# Patient Record
Sex: Male | Born: 1980 | Hispanic: No | Marital: Single | Smoking: Current every day smoker
Health system: Southern US, Community
[De-identification: ages and names within clinical notes are randomized; demographics above are authoritative.]

## PROBLEM LIST (undated history)

## (undated) DIAGNOSIS — S43109A Unspecified dislocation of unspecified acromioclavicular joint, initial encounter: Secondary | ICD-10-CM

## (undated) HISTORY — PX: NO PAST SURGERIES: SHX2092

---
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
## 2013-09-10 DIAGNOSIS — S43109A Unspecified dislocation of unspecified acromioclavicular joint, initial encounter: Secondary | ICD-10-CM

## 2013-09-10 HISTORY — DX: Unspecified dislocation of unspecified acromioclavicular joint, initial encounter: S43.109A

## 2013-10-02 ENCOUNTER — Encounter (HOSPITAL_COMMUNITY): Payer: Self-pay | Admitting: Emergency Medicine

## 2013-10-02 ENCOUNTER — Emergency Department (INDEPENDENT_AMBULATORY_CARE_PROVIDER_SITE_OTHER)
Admission: EM | Admit: 2013-10-02 | Discharge: 2013-10-02 | Disposition: A | Discharge status: Home / Self Care | Payer: Self-pay | Source: Home / Self Care

## 2013-10-02 ENCOUNTER — Emergency Department (INDEPENDENT_AMBULATORY_CARE_PROVIDER_SITE_OTHER): Payer: Self-pay

## 2013-10-02 DIAGNOSIS — S42023A Displaced fracture of shaft of unspecified clavicle, initial encounter for closed fracture: Secondary | ICD-10-CM

## 2013-10-02 DIAGNOSIS — S42022A Displaced fracture of shaft of left clavicle, initial encounter for closed fracture: Secondary | ICD-10-CM

## 2013-10-02 DIAGNOSIS — S43109A Unspecified dislocation of unspecified acromioclavicular joint, initial encounter: Secondary | ICD-10-CM

## 2013-10-02 DIAGNOSIS — S43102A Unspecified dislocation of left acromioclavicular joint, initial encounter: Secondary | ICD-10-CM

## 2013-10-02 MED ORDER — HYDROCODONE-ACETAMINOPHEN 7.5-325 MG PO TABS: 1.0000 | ORAL_TABLET | ORAL | Status: DC | PRN

## 2013-10-02 MED ORDER — KETOROLAC TROMETHAMINE 60 MG/2ML IM SOLN
60.0000 mg | Freq: Once | INTRAMUSCULAR | Status: AC
  Administered 2013-10-02: 60 mg via INTRAMUSCULAR

## 2013-10-02 MED ORDER — HYDROMORPHONE HCL 1 MG/ML IJ SOLN
1.0000 mg | Freq: Once | INTRAMUSCULAR | Status: AC
  Administered 2013-10-02: 1 mg via INTRAMUSCULAR

## 2013-10-02 MED ORDER — HYDROMORPHONE HCL PF 1 MG/ML IJ SOLN
INTRAMUSCULAR | Status: AC
  Filled 2013-10-02: qty 1

## 2013-10-02 MED ORDER — KETOROLAC TROMETHAMINE 60 MG/2ML IM SOLN
INTRAMUSCULAR | Status: AC
  Filled 2013-10-02: qty 2

## 2013-10-02 MED ORDER — DICLOFENAC POTASSIUM 50 MG PO TABS: 50.0000 mg | ORAL_TABLET | Freq: Three times a day (TID) | ORAL | Status: DC

## 2013-10-02 NOTE — Discharge Instructions (Signed)
Traumatismo acromioclavicular (Acromioclavicular Injuries) Wear sling at all times No physical activity until approved by orthopedist. Toula MoosLa articulacin acromioclavicular [facility](AC) es la articulacin del hombro. Son Jones Apparel Groupmuchas las bandas de tejido (ligamentos) que rodean a los huesos y articulaciones de la [place]. Estas bandas de tejido pueden desgarrarse, lo que puede ocasionar esguinces y separaciones. Los huesos de la articulacin AC tambin pueden quebrarse (fractura).  CUIDADOS EN EL HOGAR   Aplique hielo sobre la zona lesionada.  Ponga el hielo en una bolsa plstica.  Colquese una toalla entre la piel y la bolsa de hielo.  Deje el hielo durante 15 a 20 minutos, 3 a 4 veces por da.  Use la frula segn las indicaciones de su mdico. Qutese la frula antes de baarse y ducharse. Mantenga el hombro en Designer, jewelleryel mismo lugar, tal como cuando le colocaron la frula. No levante el brazo.  SunTrustodos los das, ajuste suavemente la frula en ocho (si corresponde). Ajstela lo suficiente como para State Street Corporationmantener los hombros contenidos. Debera dejar espacio para colocar un dedo entre su cuerpo y el tirante. Afloje el tirante inmediatamente si pierde la sensacin (adormecimiento) o tiene un hormigueo en las manos.  Slo tome los medicamentos que le haya indicado su mdico.  Cumpla con todas las visitas de control con su mdico. SOLICITE AYUDA DE INMEDIATO SI:   Los medicamentos no le Corporate treasurercalman el dolor.  Tiene ms inflamacin (hinchazn) o los hematomas estn peor en vez de mejor.  No pudo hacer el seguimiento como le indic su mdico.  Tiene un hormigueo o pierde incluso ms la sensacin en los brazos, antebrazos o Port[place]os.  Siente el brazo fro o plido.  Tiene ms dolor en la mano, el antebrazo o los dedos. ASEGRESE DE QUE:   Comprende estas instrucciones.  Controlar su afeccin.  Recibir ayuda de inmediato si no mejora o si empeora. Document Released: 12/18/2012 [facility] Patient Information 2015  [place], [place]. This information is not intended to replace advice given to you by your health care provider. Make sure you discuss any questions you have with your health care provider.  Fractura de clavcula (Clavicle Fracture) La clavcula es el hueso largo que conecta el hombro con la caja torcica. Puede sentir la clavcula en la parte superior de los hombros y la caja torcica. Una fractura de clavcula es la rotura de ese hueso. Es una lesin frecuente que puede suceder a Actuarycualquier edad.  CAUSAS Las causas ms frecuentes de la fractura de clavcula incluyen:  Un golpe directo en el hombro.  Un accidente Arlee Muslimautomovilstico.  Una cada, en especial si intenta amortiguarla con el brazo extendido. FACTORES DE RIESGO Puede correr ms riesgo si:  Es Adult nursemenor de 25aos o mayor de 75aos. La mayora de las fracturas de clavcula se presentan en personas menores de 25aos.  Es hombre.  Practica deportes de contacto. SIGNOS Y SNTOMAS La fractura de clavcula es dolorosa. Tambin dificulta el movimiento del brazo. Otros signos y sntomas pueden incluir:  Hombro cado hacia abajo y Renner Cornerhacia adelante.  Dolor al Training and development officerintentar levantar el hombro.  Hematomas, hinchazn y dolor con la palpacin en la clavcula.  Chirrido al Youth workerintentar mover el hombro.  Un bulto en la clavcula. DIAGNSTICO Por lo general, el mdico le preguntar cul es la lesin y le examinar el hombro y la clavcula para diagnosticar la fractura de clavcula. Puede tomarle una radiografa para determinar la posicin de Curatorla clavcula. TRATAMIENTO El tratamiento depende de la posicin de la clavcula despus de la fractura:  Si los  extremos fracturados del hueso no estn fuera de Environmental consultant, el mdico puede colocarle el brazo en un cabestrillo o envolverle una venda de soporte alrededor del pecho (vendaje en ocho).  Si los extremos fracturados del hueso estn fuera de Environmental consultant, puede necesitar [place]. La ciruga puede incluir colocar  tornillos, clavos o placas para estabilizar la clavcula mientras se consolida. La consolidacin puede demorar alrededor de . Cuando el mdico crea que la fractura se ha consolidado lo suficiente, es posible que tenga que hacer fisioterapia para Armed forces training and education officer movimiento normal y aumentar la fuerza del brazo. INSTRUCCIONES PARA EL CUIDADO EN EL HOGAR   Aplique hielo sobre la zona lesionada.  Ponga el hielo en una bolsa plstica.  Colquese una toalla entre la piel y la bolsa de hielo.  Colquese el hielo durante 20 minutos, 2 a 3 veces por da.  Si tiene un vendaje o una tablilla:  selo todo el tiempo y [place] solo para tomar un bao de inmersin o [place].  Cuando tome un bao o una ducha, mantenga el hombro en la misma posicin que cuando tiene el vendaje o el cabestrillo.  No levante el brazo.  Si tiene un vendaje en ocho:  Debe ajustarlo otra persona todos los [place].  Debe estar lo suficientemente ajustado como para [employer].  Deje el espacio suficiente como para que pueda introducir el dedo ndice entre el cuerpo y el vendaje.  Afloje el vendaje de inmediato si siente adormecimiento u hormigueo en las manos.  Tome los medicamentos solamente como se lo haya indicado el mdico.  Evite las actividades que empeoren la lesin o el dolor de 4a 6 semanas despus de la [place].  Cumpla con todas las visitas de control. SOLICITE ATENCIN MDICA SI:  El medicamento no lo ayuda a Engineer, materials y la hinchazn. SOLICITE ATENCIN MDICA DE INMEDIATO SI:  Siente el brazo adormecido, fro, plido incluso si la tablilla est floja. ASEGRESE DE QUE:   Comprende estas instrucciones.  Controlar su afeccin.  Recibir ayuda de inmediato si no mejora o si empeora. Document Released: 12/07/2004 Document Revised: 03/04/2013 [facility] Patient Information 2015 [place], [place]. This information is not intended to replace advice given to you by your  health care provider. Make sure you discuss any questions you have with your health care provider.  Luxacin Del Hombro (Shoulder Separation) Le han diagnosticado una luxacin en el hombro (separacin acromio-clavicular). Se produce cuando un traumatismo estira o rompe el ligamento que [employer] parte superior del omplato con la clavcula. Un traumatismo puede hacer que estos huesos se separen. Le colocarn un cabestrillo o entablillado para limitar los movimientos. INSTRUCCIONES PARA EL CUIDADO DOMICILIARIO  Aplique hielo sobre la lesin durante 15 a 20 minutos por hora mientras se encuentre despierto, durante los 2 primeros [place]. Coloque el hielo en una bolsa plstica. Coloque una toalla entre la bolsa de hielo y la piel.  Evite las actividades que aumenten Chief Technology Officer.  Use el cabestrillo o la tablilla por el tiempo que se lo indique su mdico. Si se quita la tablilla para vestirse o baarse, asegrese que el brazo se mantiene en la misma posicin como cuando la tena en Nature conservation officer. No levante el brazo.  La tablilla deber Cory Munch todos los das por otra persona. Ajstela lo suficiente como para [employer] hombros contenidos. Deje el espacio suficiente como para que pueda introducir el dedo ndice entre el cuerpo y Banker. Afljelo inmediatamente si siente adormecimiento u  hormigueo en las manos.  Utilice los medicamentos de venta libre o de prescripcin para Chief Technology Officer, Environmental health practitioner o la [place], segn se lo indique el profesional que lo asiste.  Si la lesin es grave podr Landscape architect derivacin a Music therapist. Es muy importante cumplir con todas las citas de seguimiento con el objeto de Automotive engineer complicaciones en el hombro y dolor crnico o discapacidad. SOLICITE ATENCIN MDICA SI: Aumentan el dolor y la hinchazn de la lesin y no se alivia con los medicamentos. SOLICITE ATENCIN MDICA DE INMEDIATO SI: El brazo se adormece, se torna plido, fro o tiene sensaciones anormales en la  punta de los dedos. Document Released: 12/07/2004 Document Revised: 05/22/2011 [facility] Patient Information 2015 [place], [place]. This information is not intended to replace advice given to you by your health care provider. Make sure you discuss any questions you have with your health care provider.

## 2013-10-02 NOTE — ED Provider Notes (Signed)
Medical screening examination/treatment/procedure(s) were performed by a resident physician or non-physician practitioner and as the supervising physician I was immediately available for consultation/collaboration.  Jaquasha Carnevale, MD Family Medicine   Dannon Perlow J Sharece Fleischhacker, MD 10/02/13 2147 

## 2013-10-02 NOTE — ED Notes (Addendum)
Pt alledges  He  Fell   In bathroom  Today  Around  2 pm       -  He  Did         Not  Black  Out           He  Has  Bruising  Swelling  With  Pain  On  rom    And    Palpation      Pt  Is  Awake  And  Alert  And  Oriented         denys  Any loss  Of  concoussness      pearla

## 2013-10-02 NOTE — ED Provider Notes (Signed)
CSN: [id number]     Arrival date & time 10/02/13  1655 History   First MD Initiated Contact with Patient 10/02/13 1737     Chief Complaint  Patient presents with  . Fall   (Consider location/radiation/quality/duration/timing/severity/associated sxs/prior Treatment) HPI Comments: Slipped on the floor this PM at home and fell onto his L shoulder. C/O local pain and limited mobility. Denies other injury. No head, neck, torso or other extremity pain.  Patient is a 33 y.o. male presenting with fall.  Fall Pertinent negatives include no chest pain and no headaches.    History reviewed. No pertinent past medical history. History reviewed. No pertinent past surgical history. History reviewed. No pertinent family history. History  Substance Use Topics  . Smoking status: Never Smoker   . Smokeless tobacco: Not on file  . Alcohol Use: No    Review of Systems  Constitutional: Positive for activity change. Negative for fever and fatigue.  HENT: Negative.   Respiratory: Negative.   Cardiovascular: Negative for chest pain.  Gastrointestinal: Negative.   Genitourinary: Negative.   Musculoskeletal: Positive for joint swelling. Negative for back pain, neck pain and neck stiffness.       As per HPI  Skin: Negative.   Neurological: Negative for dizziness, numbness and headaches.    Allergies  Review of patient's allergies indicates no known allergies.  Home Medications   Prior to Admission medications   Medication Sig Start Date End Date Taking? Authorizing Provider  diclofenac (CATAFLAM) 50 MG tablet Take 1 tablet (50 mg total) by mouth 3 (three) times daily. One tablet TID with food prn pain. 10/02/13   Hayden Rasmussenavid Devaeh Amadi, NP  HYDROcodone-acetaminophen (NORCO) 7.5-325 MG per tablet Take 1 tablet by mouth every 4 (four) hours as needed. 10/02/13   Hayden Rasmussenavid Raylee Strehl, NP   BP 124/76  Pulse 106  Temp(Src) 99.5 F (37.5 C) (Oral)  Resp 18  SpO2 95% Physical Exam  Nursing note and vitals  reviewed. Constitutional: He is oriented to person, place, and time. He appears well-developed and well-nourished. No distress.  HENT:  Head: Normocephalic and atraumatic.  Eyes: Conjunctivae and EOM are normal. Pupils are equal, round, and reactive to light.  Neck: Normal range of motion. Neck supple.  Cardiovascular: Normal rate, regular rhythm and normal heart sounds.   Pulmonary/Chest: Effort normal and breath sounds normal. No respiratory distress.  Musculoskeletal:  Contusion and ecchymosis to the ridge of the left trapezius. Generalized swelling to the L shoulder. Generalized pain, however greatest A/C joint and anterior joint line. Inability to abduct, rotate forward.  Shoulder asymmetry with L slumping low. Distal N/V intact, Motor intact and radial pulse 2+  Neurological: He is alert and oriented to person, place, and time. He exhibits normal muscle tone.  Skin: Skin is warm and dry.  Psychiatric: He has a normal mood and affect.    ED Course  Procedures (including critical care time) Labs Review Labs Reviewed - No data to display  Imaging Review Dg Shoulder Left  10/02/2013   CLINICAL DATA:  Fall.  EXAM: LEFT SHOULDER - 2+ VIEW  COMPARISON:  None.  FINDINGS: Severely displaced, angulated, comminuted fracture of the distal left clavicle is present. No evidence of left shoulder dislocation.  IMPRESSION: Severely displaced, angulated, comminuted fracture of the distal left clavicle.   Electronically Signed   By: Maisie Fushomas  Register   On: 10/02/2013 17:36     MDM   1. Fracture, clavicle closed, shaft, left, initial encounter   2. AC separation, left,  initial encounter     Spoke with Dr. Luiz Blare twice re management plan. Will apply sling to elevate shoulder and relieve pressure form the fragment beneath the skin which remains intact. He st was going to call his partner for arrangements but an hr later have not rec'd another call.  Dilaudid 1 mg IM and Toradol 60 mg IM Call the  above orthopedist tomorrow for f/u appt. See him in office tomorrow. Norco 7.5 mg po  # 20    Hayden Rasmussen, NP 10/02/13 2035

## 2013-10-06 ENCOUNTER — Encounter (HOSPITAL_BASED_OUTPATIENT_CLINIC_OR_DEPARTMENT_OTHER): Payer: Self-pay | Admitting: *Deleted

## 2013-10-06 NOTE — Pre-Procedure Instructions (Signed)
Spanish interpreter req. for 10/09/2013 for 1130 - 1645; [facility] with Judeth [facility]fieldStephanie [facility] [phone]- [phone]

## 2013-10-07 ENCOUNTER — Other Ambulatory Visit: Payer: Self-pay | Admitting: Orthopedic Surgery

## 2013-10-09 ENCOUNTER — Ambulatory Visit (HOSPITAL_COMMUNITY): Payer: Self-pay

## 2013-10-09 ENCOUNTER — Encounter (HOSPITAL_BASED_OUTPATIENT_CLINIC_OR_DEPARTMENT_OTHER)
Admission: RE | Disposition: A | Discharge status: Home / Self Care | Payer: Self-pay | Source: Ambulatory Visit | Attending: Orthopedic Surgery

## 2013-10-09 ENCOUNTER — Ambulatory Visit (HOSPITAL_BASED_OUTPATIENT_CLINIC_OR_DEPARTMENT_OTHER): Payer: Self-pay | Admitting: Anesthesiology

## 2013-10-09 ENCOUNTER — Ambulatory Visit (HOSPITAL_BASED_OUTPATIENT_CLINIC_OR_DEPARTMENT_OTHER)
Admission: RE | Admit: 2013-10-09 | Discharge: 2013-10-09 | Disposition: A | Discharge status: Home / Self Care | Payer: Self-pay | Source: Ambulatory Visit | Attending: Orthopedic Surgery | Admitting: Orthopedic Surgery

## 2013-10-09 ENCOUNTER — Encounter (HOSPITAL_BASED_OUTPATIENT_CLINIC_OR_DEPARTMENT_OTHER): Payer: Self-pay

## 2013-10-09 ENCOUNTER — Encounter (HOSPITAL_BASED_OUTPATIENT_CLINIC_OR_DEPARTMENT_OTHER): Payer: Self-pay | Admitting: Anesthesiology

## 2013-10-09 DIAGNOSIS — W19XXXA Unspecified fall, initial encounter: Secondary | ICD-10-CM | POA: Insufficient documentation

## 2013-10-09 DIAGNOSIS — S42033A Displaced fracture of lateral end of unspecified clavicle, initial encounter for closed fracture: Secondary | ICD-10-CM | POA: Insufficient documentation

## 2013-10-09 DIAGNOSIS — F17200 Nicotine dependence, unspecified, uncomplicated: Secondary | ICD-10-CM | POA: Insufficient documentation

## 2013-10-09 DIAGNOSIS — S42032D Displaced fracture of lateral end of left clavicle, subsequent encounter for fracture with routine healing: Secondary | ICD-10-CM

## 2013-10-09 HISTORY — DX: Unspecified dislocation of unspecified acromioclavicular joint, initial encounter: S43.109A

## 2013-10-09 HISTORY — PX: ACROMIO-CLAVICULAR JOINT REPAIR: SHX5183

## 2013-10-09 SURGERY — REPAIR, ACROMIOCLAVICULAR JOINT
Anesthesia: General | Site: Shoulder | Laterality: Left

## 2013-10-09 MED ORDER — CEFAZOLIN SODIUM-DEXTROSE 2-3 GM-% IV SOLR
INTRAVENOUS | Status: AC
  Filled 2013-10-09: qty 50

## 2013-10-09 MED ORDER — LACTATED RINGERS IV SOLN
INTRAVENOUS | Status: DC
  Administered 2013-10-09 (×3): via INTRAVENOUS

## 2013-10-09 MED ORDER — FENTANYL CITRATE 0.05 MG/ML IJ SOLN
INTRAMUSCULAR | Status: AC
  Filled 2013-10-09: qty 2

## 2013-10-09 MED ORDER — METOCLOPRAMIDE HCL 5 MG/ML IJ SOLN: 10.0000 mg | Freq: Once | INTRAMUSCULAR | Status: DC | PRN

## 2013-10-09 MED ORDER — DOCUSATE SODIUM 100 MG PO CAPS: 100.0000 mg | ORAL_CAPSULE | Freq: Three times a day (TID) | ORAL | Status: AC | PRN

## 2013-10-09 MED ORDER — MIDAZOLAM HCL 2 MG/2ML IJ SOLN
INTRAMUSCULAR | Status: AC
  Filled 2013-10-09: qty 2

## 2013-10-09 MED ORDER — HYDROMORPHONE HCL PF 1 MG/ML IJ SOLN
0.2500 mg | INTRAMUSCULAR | Status: DC | PRN
Start: 2013-10-09 — End: 2013-10-09

## 2013-10-09 MED ORDER — MIDAZOLAM HCL 2 MG/ML PO SYRP: 12.0000 mg | ORAL_SOLUTION | Freq: Once | ORAL | Status: DC | PRN

## 2013-10-09 MED ORDER — PROPOFOL 10 MG/ML IV BOLUS
INTRAVENOUS | Status: DC | PRN
  Administered 2013-10-09: 200 mg via INTRAVENOUS

## 2013-10-09 MED ORDER — ONDANSETRON HCL 4 MG/2ML IJ SOLN
INTRAMUSCULAR | Status: DC | PRN
  Administered 2013-10-09: 4 mg via INTRAVENOUS

## 2013-10-09 MED ORDER — CEFAZOLIN SODIUM-DEXTROSE 2-3 GM-% IV SOLR
2.0000 g | INTRAVENOUS | Status: AC
Start: 2013-10-10 — End: 2013-10-09
  Administered 2013-10-09: 2 g via INTRAVENOUS

## 2013-10-09 MED ORDER — LIDOCAINE HCL (CARDIAC) 20 MG/ML IV SOLN
INTRAVENOUS | Status: DC | PRN
  Administered 2013-10-09: 100 mg via INTRAVENOUS

## 2013-10-09 MED ORDER — BUPIVACAINE HCL (PF) 0.5 % IJ SOLN
INTRAMUSCULAR | Status: DC | PRN
  Administered 2013-10-09: 30 mL via PERINEURAL

## 2013-10-09 MED ORDER — MIDAZOLAM HCL 2 MG/2ML IJ SOLN
1.0000 mg | INTRAMUSCULAR | Status: DC | PRN
  Administered 2013-10-09: 2 mg via INTRAVENOUS

## 2013-10-09 MED ORDER — FENTANYL CITRATE 0.05 MG/ML IJ SOLN
INTRAMUSCULAR | Status: DC | PRN
Start: 2013-10-09 — End: 2013-10-09
  Administered 2013-10-09 (×2): 50 ug via INTRAVENOUS

## 2013-10-09 MED ORDER — OXYCODONE-ACETAMINOPHEN 5-325 MG PO TABS
1.0000 | ORAL_TABLET | ORAL | Status: AC | PRN
Start: 2013-10-09 — End: ?

## 2013-10-09 MED ORDER — OXYCODONE HCL 5 MG PO TABS: 5.0000 mg | ORAL_TABLET | Freq: Once | ORAL | Status: DC | PRN

## 2013-10-09 MED ORDER — SUCCINYLCHOLINE CHLORIDE 20 MG/ML IJ SOLN
INTRAMUSCULAR | Status: DC | PRN
  Administered 2013-10-09: 100 mg via INTRAVENOUS

## 2013-10-09 MED ORDER — OXYCODONE HCL 5 MG/5ML PO SOLN: 5.0000 mg | Freq: Once | ORAL | Status: DC | PRN

## 2013-10-09 MED ORDER — FENTANYL CITRATE 0.05 MG/ML IJ SOLN
50.0000 ug | INTRAMUSCULAR | Status: DC | PRN
  Administered 2013-10-09: 100 ug via INTRAVENOUS

## 2013-10-09 MED ORDER — CHLORHEXIDINE GLUCONATE 4 % EX LIQD: 60.0000 mL | Freq: Once | CUTANEOUS | Status: DC

## 2013-10-09 MED ORDER — DEXAMETHASONE SODIUM PHOSPHATE 4 MG/ML IJ SOLN
INTRAMUSCULAR | Status: DC | PRN
  Administered 2013-10-09: 10 mg via INTRAVENOUS

## 2013-10-09 SURGICAL SUPPLY — 63 items
BLADE AVERAGE 25X9 (BLADE) IMPLANT
BLADE SURG 15 STRL LF DISP TIS (BLADE) ×2 IMPLANT
BLADE SURG 15 STRL SS (BLADE) ×2
CANISTER SUCT 3000ML (MISCELLANEOUS) IMPLANT
CHLORAPREP W/TINT 26ML (MISCELLANEOUS) ×2 IMPLANT
DECANTER SPIKE VIAL GLASS SM (MISCELLANEOUS) IMPLANT
DRAPE C-ARM 42X72 X-RAY (DRAPES) ×2 IMPLANT
DRAPE INCISE IOBAN 66X45 STRL (DRAPES) ×2 IMPLANT
DRAPE U-SHAPE 47X51 STRL (DRAPES) ×4 IMPLANT
DRAPE U-SHAPE 76X120 STRL (DRAPES) ×4 IMPLANT
DRSG TEGADERM 4X4.75 (GAUZE/BANDAGES/DRESSINGS) ×4 IMPLANT
ELECT BLADE 6.5 .24CM SHAFT (ELECTRODE) IMPLANT
ELECT REM PT RETURN 9FT ADLT (ELECTROSURGICAL) ×2
ELECTRODE REM PT RTRN 9FT ADLT (ELECTROSURGICAL) ×1 IMPLANT
GAUZE SPONGE 4X4 12PLY STRL (GAUZE/BANDAGES/DRESSINGS) ×2 IMPLANT
GAUZE SPONGE 4X4 16PLY XRAY LF (GAUZE/BANDAGES/DRESSINGS) IMPLANT
GLOVE BIO SURGEON STRL SZ7 (GLOVE) ×2 IMPLANT
GLOVE BIOGEL PI IND STRL 7.0 (GLOVE) ×5 IMPLANT
GLOVE BIOGEL PI IND STRL 8 (GLOVE) ×2 IMPLANT
GLOVE BIOGEL PI INDICATOR 7.0 (GLOVE) ×5
GLOVE BIOGEL PI INDICATOR 8 (GLOVE) ×2
GOWN STRL REUS W/ TWL LRG LVL3 (GOWN DISPOSABLE) ×1 IMPLANT
GOWN STRL REUS W/TWL LRG LVL3 (GOWN DISPOSABLE) ×1
KIT AC JOINT DISP (KITS) ×2 IMPLANT
KIT BIO-TENODESIS 3X8 DISP (MISCELLANEOUS)
KIT INSRT BABSR STRL DISP BTN (MISCELLANEOUS) IMPLANT
NS IRRIG 1000ML POUR BTL (IV SOLUTION) ×2 IMPLANT
PACK ARTHROSCOPY DSU (CUSTOM PROCEDURE TRAY) ×2 IMPLANT
PACK BASIN DAY SURGERY FS (CUSTOM PROCEDURE TRAY) ×2 IMPLANT
PASSER SUT SWANSON 36MM LOOP (INSTRUMENTS) IMPLANT
PENCIL BUTTON HOLSTER BLD 10FT (ELECTRODE) ×2 IMPLANT
RETRIEVER SUT HEWSON (MISCELLANEOUS) ×2 IMPLANT
SHEET MEDIUM DRAPE 40X70 STRL (DRAPES) IMPLANT
SLEEVE SCD COMPRESS KNEE MED (MISCELLANEOUS) ×2 IMPLANT
SLEEVE SURGEON STRL (DRAPES) ×2 IMPLANT
SLING ARM IMMOBILIZER MED (SOFTGOODS) ×2 IMPLANT
SLING ARM LRG ADULT FOAM STRAP (SOFTGOODS) IMPLANT
SLING ARM MED ADULT FOAM STRAP (SOFTGOODS) IMPLANT
SLING ARM XL FOAM STRAP (SOFTGOODS) IMPLANT
SPONGE LAP 4X18 X RAY DECT (DISPOSABLE) ×4 IMPLANT
STRIP CLOSURE SKIN 1/2X4 (GAUZE/BANDAGES/DRESSINGS) ×2 IMPLANT
SUCTION FRAZIER TIP 10 FR DISP (SUCTIONS) IMPLANT
SUT 2 FIBERLOOP 20 STRT BLUE (SUTURE)
SUT ETHIBOND 2 OS 4 DA (SUTURE) IMPLANT
SUT ETHILON 4 0 PS 2 18 (SUTURE) IMPLANT
SUT FIBERWIRE #2 38 T-5 BLUE (SUTURE)
SUT MNCRL AB 3-0 PS2 18 (SUTURE) IMPLANT
SUT MNCRL AB 4-0 PS2 18 (SUTURE) ×2 IMPLANT
SUT PDS 2 CP NEEDLE XSPECIAL (SUTURE) ×2 IMPLANT
SUT PDS AB 0 CT 36 (SUTURE) ×2 IMPLANT
SUT PROLENE 3 0 PS 2 (SUTURE) IMPLANT
SUT TIGER TAPE 7 IN WHITE (SUTURE) IMPLANT
SUT VIC AB 0 CT1 27 (SUTURE)
SUT VIC AB 0 CT1 27XBRD ANBCTR (SUTURE) IMPLANT
SUT VIC AB 2-0 SH 27 (SUTURE) ×1
SUT VIC AB 2-0 SH 27XBRD (SUTURE) ×1 IMPLANT
SUTURE 2 FIBERLOOP 20 STRT BLU (SUTURE) IMPLANT
SUTURE FIBERWR #2 38 T-5 BLUE (SUTURE) IMPLANT
SYR BULB 3OZ (MISCELLANEOUS) ×2 IMPLANT
TOWEL OR 17X24 6PK STRL BLUE (TOWEL DISPOSABLE) ×4 IMPLANT
TOWEL OR NON WOVEN STRL DISP B (DISPOSABLE) ×2 IMPLANT
YANKAUER SUCT BULB TIP NO VENT (SUCTIONS) ×2 IMPLANT
ZIPLOOP AC JOINT REPAIR (Orthopedic Implant) ×2 IMPLANT

## 2013-10-09 NOTE — Discharge Instructions (Signed)
Discharge Instructions after Open Shoulder Repair ° °A sling has been provided for you. Remain in your sling at all times. This includes sleeping in your sling.  °Use ice on the shoulder intermittently over the first 48 hours after surgery.  °Pain medicine has been prescribed for you.  °Use your medicine liberally over the first 48 hours, and then you can begin to taper your use. You may take Extra Strength Tylenol or Tylenol only in place of the pain pills. DO NOT take ANY nonsteroidal anti-inflammatory pain medications: Advil, Motrin, Ibuprofen, Aleve, Naproxen or Naprosyn.  °You may remove your dressing after two days  °You may shower 5 days after surgery. The incisions CANNOT get wet prior to 5 days. Simply allow the water to wash over the site and then pat dry. Do not rub the incisions. Make sure your axilla (armpit) is completely dry after showering.  °Take one aspirin, a day for 2 weeks after surgery, unless you have an aspirin sensitivity/ allergy or asthma. ° ° °Please call [phone] during normal business hours or [phone] after hours for any problems. Including the following: ° °- excessive redness of the incisions °- drainage for more than 4 days °- fever of more than 101.5 F ° °*Please note that pain medications will not be refilled after hours or on weekends. ° °Regional Anesthesia Blocks ° °1. Numbness or the inability to move the "blocked" extremity may last from 3-48 hours after placement. The length of time depends on the medication injected and your individual response to the medication. If the numbness is not going away after 48 hours, call your surgeon. ° °2. The extremity that is blocked will need to be protected until the numbness is gone and the  Strength has returned. Because you cannot feel it, you will need to take extra care to avoid injury. Because it may be weak, you may have difficulty moving it or using it. You may not know what position it is in without looking at it while the  block is in effect. ° °3. For blocks in the legs and feet, returning to weight bearing and walking needs to be done carefully. You will need to wait until the numbness is entirely gone and the strength has returned. You should be able to move your leg and foot normally before you try and bear weight or walk. You will need someone to be with you when you first try to ensure you do not fall and possibly risk injury. ° °4. Bruising and tenderness at the needle site are common side effects and will resolve in a few days. ° °5. Persistent numbness or new problems with movement should be communicated to the surgeon or the [facility] ([phone])/ [facility] ([phone]). °

## 2013-10-09 NOTE — Anesthesia Preprocedure Evaluation (Signed)
Anesthesia Evaluation  Patient identified by MRN, date of birth, ID band Patient awake    Reviewed: Allergy & Precautions, H&P , NPO status , Patient's Chart, lab work & pertinent test results, reviewed documented beta blocker date and time   Airway Mallampati: II TM Distance: >3 FB Neck ROM: full    Dental   Pulmonary Current Smoker,  breath sounds clear to auscultation        Cardiovascular negative cardio ROS  Rhythm:regular     Neuro/Psych negative neurological ROS  negative psych ROS   GI/Hepatic negative GI ROS, Neg liver ROS,   Endo/Other  negative endocrine ROS  Renal/GU negative Renal ROS  negative genitourinary   Musculoskeletal   Abdominal   Peds  Hematology negative hematology ROS (+)   Anesthesia Other Findings See surgeon's H&P   Reproductive/Obstetrics negative OB ROS                           Anesthesia Physical Anesthesia Plan  ASA: II  Anesthesia Plan: General   Post-op Pain Management:    Induction: Intravenous  Airway Management Planned: Oral ETT  Additional Equipment:   Intra-op Plan:   Post-operative Plan: Extubation in OR  Informed Consent: I have reviewed the patients History and Physical, chart, labs and discussed the procedure including the risks, benefits and alternatives for the proposed anesthesia with the patient or authorized representative who has indicated his/her understanding and acceptance.   Dental Advisory Given  Plan Discussed with: CRNA and Surgeon  Anesthesia Plan Comments:         Anesthesia Quick Evaluation  

## 2013-10-09 NOTE — Transfer of Care (Signed)
Immediate Anesthesia Transfer of Care Note  Patient: Danny Chavez  Procedure(s) Performed: Procedure(s): ACROMIO-CLAVICULAR JOINT REPAIR (Left)  Patient Location: PACU  Anesthesia Type:GA combined with regional for post-op pain  Level of Consciousness: awake and patient cooperative  Airway & Oxygen Therapy: Patient Spontanous Breathing and Patient connected to face mask oxygen  Post-op Assessment: Report given to PACU RN, Post -op Vital signs reviewed and stable, Patient moving all extremities and Patient able to stick tongue midline  Post vital signs: Reviewed and stable  Complications: No apparent anesthesia complications

## 2013-10-09 NOTE — Pre-Procedure Instructions (Signed)
Marlynn PerkingMaria Elena will be interpreter for pt., per Judeth CornfieldStephanie at [facility] [place].

## 2013-10-09 NOTE — Anesthesia Procedure Notes (Addendum)
Anesthesia Regional Block:  Interscalene brachial plexus block  Pre-Anesthetic Checklist: ,, timeout performed, Correct Patient, Correct Site, Correct Laterality, Correct Procedure, Correct Position, site marked, Risks and benefits discussed,  Surgical consent,  Pre-op evaluation,  At surgeon's request and post-op pain management  Laterality: Left  Prep: chloraprep       Needles:   Needle Type: Other     Needle Length: 9cm 9 cm Needle Gauge: 21 and 21 G    Additional Needles:  Procedures: ultrasound guided (picture in chart) Interscalene brachial plexus block Narrative:  Start time: 10/09/2013 1:20 PM End time: 10/09/2013 1:28 PM Injection made incrementally with aspirations every 5 mL.  Performed by: Personally  Anesthesiologist: Aldona Lento Frederick, MD  Additional Notes: Ultrasound guidance used to: id relevant anatomy, confirm needle position, local anesthetic spread, avoidance of vascular puncture. Picture saved. No complications. Block performed personally by Janetta Horaharles E. Gelene MinkFrederick, MD     Procedure Name: Intubation Date/Time: 10/09/2013 2:53 PM Performed by: Burna CashONRAD, Jaequan Propes C Pre-anesthesia Checklist: Patient identified, Emergency Drugs available, Suction available and Patient being monitored Patient Re-evaluated:Patient Re-evaluated prior to inductionOxygen Delivery Method: [place] System Utilized Preoxygenation: Pre-oxygenation with 100% oxygen Intubation Type: IV induction Ventilation: Mask ventilation without difficulty Grade View: Grade II Tube type: Oral Tube size: 8.0 mm Number of attempts: 1 Airway Equipment and Method: stylet and oral airway Placement Confirmation: ETT inserted through vocal cords under direct vision,  positive ETCO2 and breath sounds checked- equal and bilateral Secured at: 23 cm Tube secured with: Tape Dental Injury: Teeth and Oropharynx as per pre-operative assessment

## 2013-10-09 NOTE — H&P (Signed)
Danny OddiJuan Chavez is an 33 y.o. male.   Chief Complaint:L shoulder pain HPI: s/p fall with L displaced distal clavicle fx   Past Medical History  Diagnosis Date  . Acromioclavicular joint separation 09/2013    left    Past Surgical History  Procedure Laterality Date  . No past surgeries      History reviewed. No pertinent family history. Social History:  reports that he has been smoking Cigarettes.  He has been smoking about 0.00 packs per day for the past 4 years. He has never used smokeless tobacco. He reports that he does not drink alcohol or use illicit drugs.  Allergies:  Allergies  Allergen Reactions  . Bee Venom Anaphylaxis    Medications Prior to Admission  Medication Sig Dispense Refill  . diclofenac (CATAFLAM) 50 MG tablet Take 1 tablet (50 mg total) by mouth 3 (three) times daily. One tablet TID with food prn pain.  21 tablet  0  . EPINEPHrine (EPIPEN) 0.3 mg/0.3 mL IJ SOAJ injection Inject into the muscle once.      Marland Kitchen. HYDROcodone-acetaminophen (NORCO) 7.5-325 MG per tablet Take 1 tablet by mouth every 4 (four) hours as needed.  20 tablet  0  . HYDROcodone-acetaminophen (NORCO/VICODIN) 5-325 MG per tablet Take 1 tablet by mouth every 6 (six) hours as needed for moderate pain.      . NON FORMULARY daily. OMNILIFE        No results found for this or any previous visit (from the past 48 hour(s)). No results found.  Review of Systems  All other systems reviewed and are negative.   Blood pressure 144/84, pulse 72, temperature 99.2 F (37.3 C), temperature source Oral, resp. rate 19, height 5' 5.75" (1.67 m), weight 80.287 kg (177 lb), SpO2 98.00%. Physical Exam  Constitutional: He is oriented to person, place, and time. He appears well-developed and well-nourished.  HENT:  Head: Atraumatic.  Eyes: EOM are normal.  Cardiovascular: Intact distal pulses.   Respiratory: Effort normal.  Musculoskeletal:  L shoulder ecchymosis, prominence of distal clavicle. NVID   Neurological: He is alert and oriented to person, place, and time.  Skin: Skin is warm and dry.  Psychiatric: He has a normal mood and affect.     Assessment/Plan L  Displaced distal clavicle fx Plan CC lig recon with DCE Risks / benefits of surgery discussed Consent on chart  NPO for OR Preop antibiotics   [name] WILLIAM 10/09/2013, 2:35 PM

## 2013-10-09 NOTE — Progress Notes (Signed)
Assisted Dr. Frederick with left, ultrasound guided, interscalene  block. Side rails up, monitors on throughout procedure. See vital signs in flow sheet. Tolerated Procedure well. 

## 2013-10-09 NOTE — Anesthesia Postprocedure Evaluation (Signed)
Anesthesia Post Note  Patient: Danny Chavez  Procedure(s) Performed: Procedure(s) (LRB): ACROMIO-CLAVICULAR JOINT REPAIR (Left)  Anesthesia type: General  Patient location: PACU  Post pain: Pain level controlled  Post assessment: Patient's Cardiovascular Status Stable  Last Vitals:  Filed Vitals:   10/09/13 1700  BP: 144/94  Pulse: 84  Temp:   Resp: 22    Post vital signs: Reviewed and stable  Level of consciousness: alert  Complications: No apparent anesthesia complications

## 2013-10-09 NOTE — Op Note (Signed)
Procedure(s): ACROMIO-CLAVICULAR JOINT REPAIR Procedure Note  Danny Chavez male 33 y.o. 10/09/2013  Procedure(s) and Anesthesia Type:   Open Reconstruction coracoclavicular ligaments for internal fixation of displaced type II distal clavicle fracture  Surgeon(s) and Role:    * Mable ParisJustin William Jaiveon Suppes, MD - Primary   Indications:  33 y.o. male s/p fall with left displaced distal clavicle fracture. Indicated for surgery to promote anatomic restoration anatomy, decrease risk of nonunion, improve functional outcome and avoid skin complications.     Surgeon: Mable ParisHANDLER,Aliene Tamura WILLIAM   Assistants: Damita Lackanielle Lalibert PA-C [facility](Danielle was present and scrubbed throughout the procedure and was essential in positioning, retraction, exposure, and closure)  Anesthesia: General endotracheal anesthesia with preoperative interscalene block    Procedure Detail  ACROMIO-CLAVICULAR JOINT REPAIR  Findings: Fractures reduced with a Biomet a.c. toggle lock device  Estimated Blood Loss:  less than 100 mL         Drains: none  Blood Given: none         Specimens: none        Complications:  * No complications entered in OR log *         Disposition: PACU - hemodynamically stable.         Condition: stable    Procedure:   DESCRIPTION OF PROCEDURE: The patient was identified in preoperative  holding area where I personally marked the operative site after  verifying site, side, and procedure with the patient. The patient was taken back  to the operating room where general anesthesia was induced without  complication and was placed in the beach-chair position with the back  elevated about 40 degrees and all extremities carefully padded and  positioned.  The left upper extremity was then prepped and  draped in a standard sterile fashion. The appropriate time-out  procedure was carried out. The patient did receive IV antibiotics  within 30 minutes of incision.  An incision was made in [employer]  lines centered over the fracture site. Dissection was carried down through subcutaneous tissues and medial and lateral skin flaps were elevated.  The deltotrapezial fascia was then opened over the clavicle and taken off the anterior clavicle to expose the fracture fragments beneath which were still connected to the coracoclavicular ligaments. The coracoid was palpable just anterior to the fracture fragments. Fractures were placed on either side of the coracoid and a guidepin was placed centrally with fluoroscopic imaging. This was overreamed with a 4.5 mm reamer and the toggle lock device was advanced left with excellent fixation. The fracture was then held reduced and a guidepin was used to create another 4.5 mm tunnel through the proximal segment of fracture and the sutures were passed through the dorsal aspect. The metallic button device was then advanced over the dorsal aspect of the clavicle holding the fracture reduced with counter pressure on the proximal segment and upper pressure on the elbow. This nicely reduced the fracture fragments together and effectively close down the coracoclavicular distance. The distal fragment  incorporated the inferior piece which was connected to the coracoclavicular ligament since I felt that leaving this in place be beneficial rather than performing a distal clavicle excision at the time of this surgery. The wound is copiously irrigated with normal saline. The deltotrapezial fascia was closed over the clavicle with 0 PDS suture in interrupted figure-of-eight fashion. Skin was then closed with 2-0 Vicryl and 4-0 Monocryl. Steri-Strips were applied. A light sterile dressing was applied. The patient was placed in a sling immobilizer, allowed to  awaken from anesthesia and transferred to stretcher and taken to recovery room in stable condition.  POSTOPERATIVE PLAN: He will be discharged today with family. He will remain in the sling for about 4 weeks postoperatively with the   elbow, wrist, and hand motion only, and then will advance his shoulder  motion once there is some healing seen on x-ray.

## 2013-10-10 ENCOUNTER — Encounter (HOSPITAL_BASED_OUTPATIENT_CLINIC_OR_DEPARTMENT_OTHER): Payer: Self-pay | Admitting: Orthopedic Surgery

## 2013-10-10 LAB — POCT HEMOGLOBIN-HEMACUE: Hemoglobin: 12.4 g/dL — ABNORMAL LOW (ref 13.0–17.0)

## 2013-10-15 ENCOUNTER — Encounter (HOSPITAL_BASED_OUTPATIENT_CLINIC_OR_DEPARTMENT_OTHER): Payer: Self-pay | Admitting: Orthopedic Surgery

## 2016-01-16 IMAGING — CR DG SHOULDER 2+V*L*
3 series · 3 of 3 positions shown · non-contrast
Comparison: None.

CLINICAL DATA: Fall.

EXAM:
LEFT SHOULDER - 2+ VIEW

[view not recorded (1 of 3)]
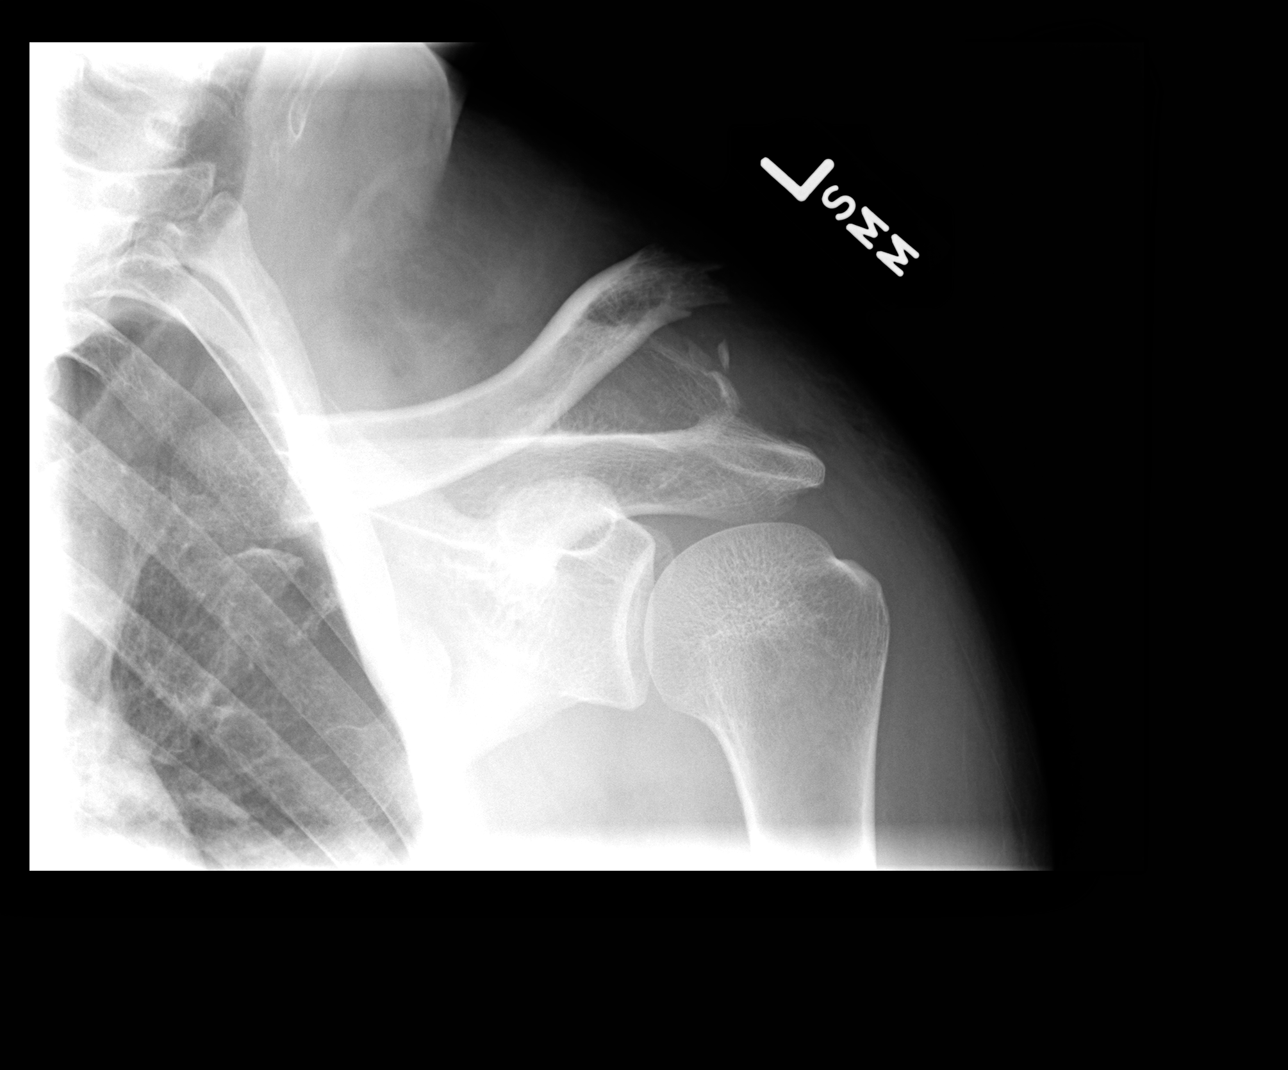

[view not recorded (2 of 3)]
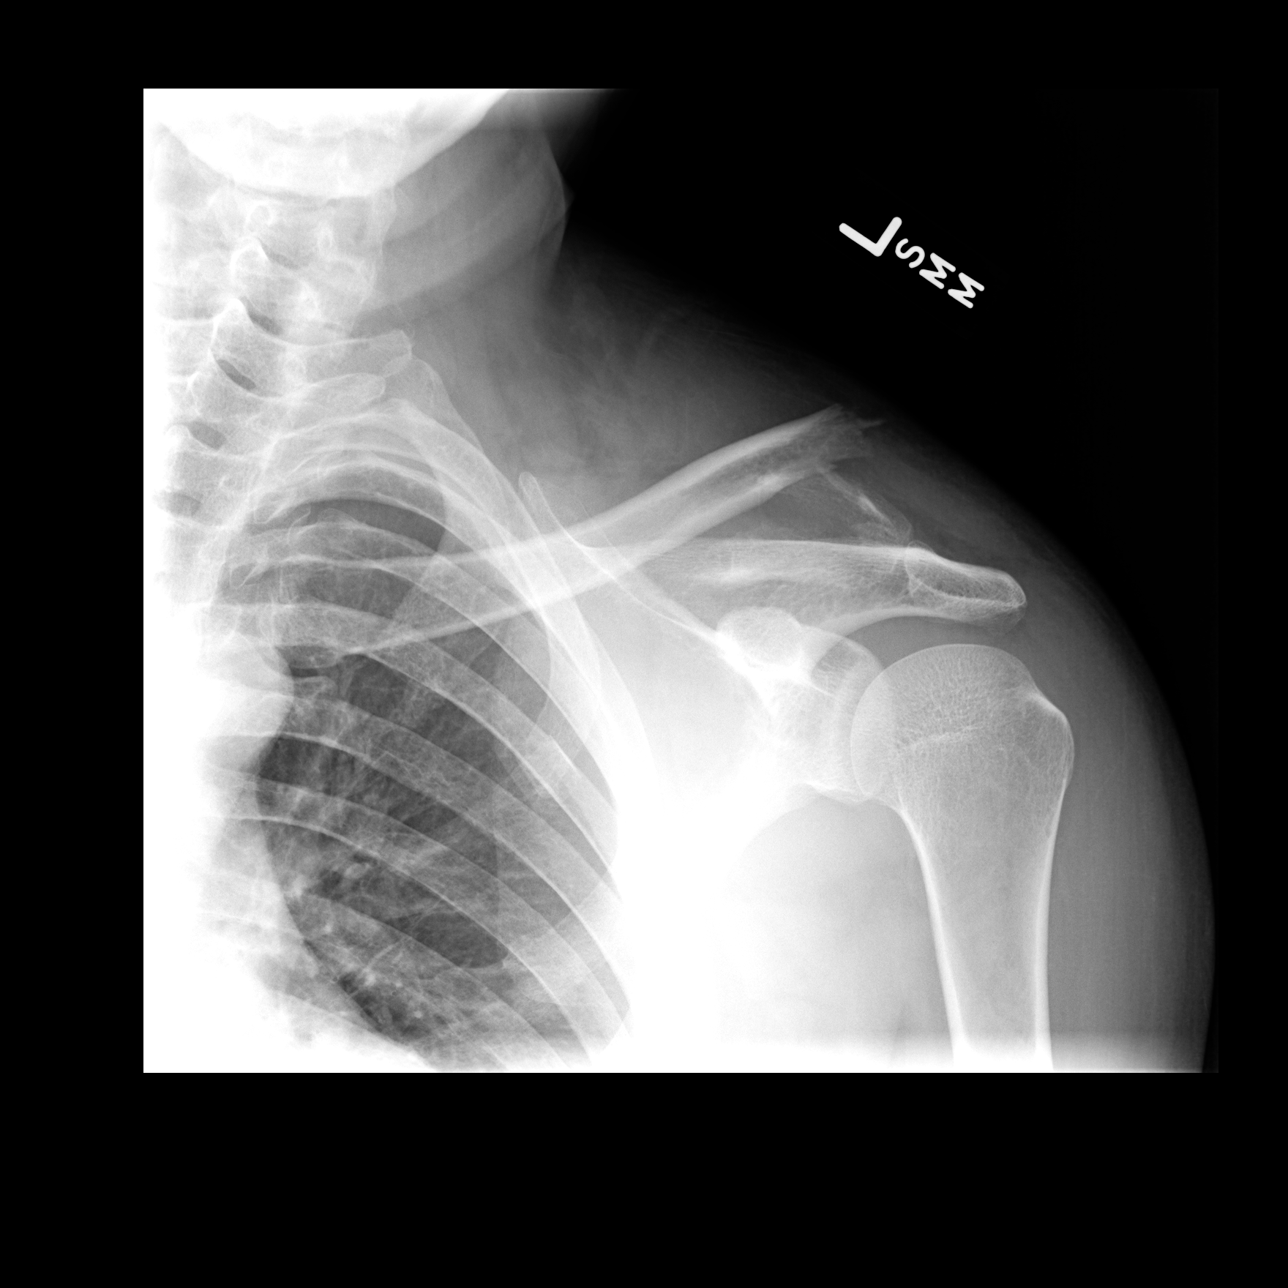

[view not recorded (3 of 3)]
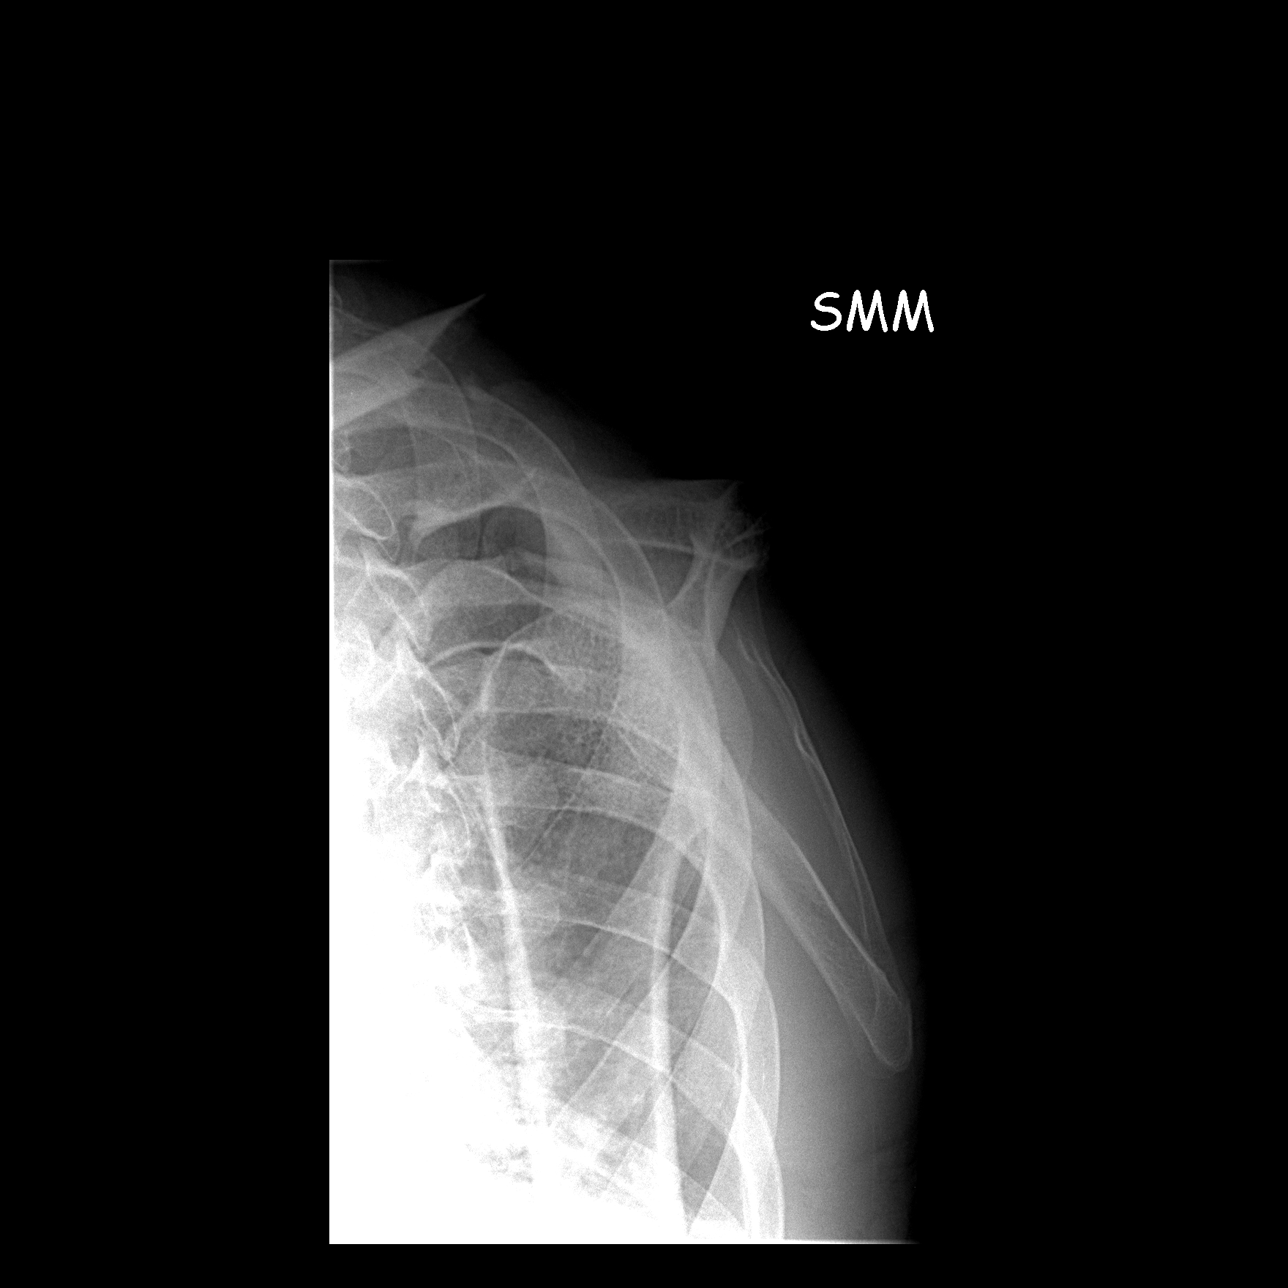

[3 of 3 positions shown; findings below may reference images not displayed]

FINDINGS: Severely displaced, angulated, comminuted fracture of the distal
left clavicle is present. No evidence of left shoulder dislocation.
IMPRESSION: Severely displaced, angulated, comminuted fracture of the distal
left clavicle.
